# Patient Record
Sex: Female | Born: 1940 | Race: White | Hispanic: No | Marital: Married | Smoking: Former smoker
Health system: Southern US, Community
[De-identification: ages and names within clinical notes are randomized; demographics above are authoritative.]

## PROBLEM LIST (undated history)

## (undated) DIAGNOSIS — G919 Hydrocephalus, unspecified: Secondary | ICD-10-CM

## (undated) DIAGNOSIS — U099 Post covid-19 condition, unspecified: Secondary | ICD-10-CM

## (undated) DIAGNOSIS — J329 Chronic sinusitis, unspecified: Secondary | ICD-10-CM

## (undated) DIAGNOSIS — I4891 Unspecified atrial fibrillation: Secondary | ICD-10-CM

## (undated) HISTORY — PX: TYMPANOSTOMY TUBE PLACEMENT: SHX32

## (undated) HISTORY — PX: BACK SURGERY: SHX140

---

## 2017-01-23 DIAGNOSIS — I4892 Unspecified atrial flutter: Secondary | ICD-10-CM | POA: Diagnosis not present

## 2017-01-23 DIAGNOSIS — M810 Age-related osteoporosis without current pathological fracture: Secondary | ICD-10-CM | POA: Diagnosis not present

## 2017-01-23 DIAGNOSIS — I10 Essential (primary) hypertension: Secondary | ICD-10-CM | POA: Diagnosis not present

## 2017-01-23 DIAGNOSIS — E039 Hypothyroidism, unspecified: Secondary | ICD-10-CM | POA: Diagnosis not present

## 2017-01-23 DIAGNOSIS — E7800 Pure hypercholesterolemia, unspecified: Secondary | ICD-10-CM | POA: Diagnosis not present

## 2017-01-23 DIAGNOSIS — M47817 Spondylosis without myelopathy or radiculopathy, lumbosacral region: Secondary | ICD-10-CM | POA: Diagnosis not present

## 2017-02-10 DIAGNOSIS — Z719 Counseling, unspecified: Secondary | ICD-10-CM | POA: Diagnosis not present

## 2017-02-10 DIAGNOSIS — K580 Irritable bowel syndrome with diarrhea: Secondary | ICD-10-CM | POA: Diagnosis not present

## 2017-02-12 DIAGNOSIS — M810 Age-related osteoporosis without current pathological fracture: Secondary | ICD-10-CM | POA: Diagnosis not present

## 2017-02-12 DIAGNOSIS — M8580 Other specified disorders of bone density and structure, unspecified site: Secondary | ICD-10-CM | POA: Diagnosis not present

## 2017-02-12 DIAGNOSIS — M8589 Other specified disorders of bone density and structure, multiple sites: Secondary | ICD-10-CM | POA: Diagnosis not present

## 2017-02-17 DIAGNOSIS — M7918 Myalgia, other site: Secondary | ICD-10-CM | POA: Diagnosis not present

## 2017-02-17 DIAGNOSIS — M25511 Pain in right shoulder: Secondary | ICD-10-CM | POA: Diagnosis not present

## 2017-02-24 DIAGNOSIS — M461 Sacroiliitis, not elsewhere classified: Secondary | ICD-10-CM | POA: Diagnosis not present

## 2017-02-24 DIAGNOSIS — R52 Pain, unspecified: Secondary | ICD-10-CM | POA: Diagnosis not present

## 2017-03-11 DIAGNOSIS — H524 Presbyopia: Secondary | ICD-10-CM | POA: Diagnosis not present

## 2017-03-11 DIAGNOSIS — H26492 Other secondary cataract, left eye: Secondary | ICD-10-CM | POA: Diagnosis not present

## 2017-04-10 DIAGNOSIS — I10 Essential (primary) hypertension: Secondary | ICD-10-CM | POA: Diagnosis not present

## 2017-04-10 DIAGNOSIS — I483 Typical atrial flutter: Secondary | ICD-10-CM | POA: Diagnosis not present

## 2017-04-10 DIAGNOSIS — E7800 Pure hypercholesterolemia, unspecified: Secondary | ICD-10-CM | POA: Diagnosis not present

## 2017-05-04 DIAGNOSIS — M461 Sacroiliitis, not elsewhere classified: Secondary | ICD-10-CM | POA: Diagnosis not present

## 2017-05-04 DIAGNOSIS — M479 Spondylosis, unspecified: Secondary | ICD-10-CM | POA: Diagnosis not present

## 2017-05-04 DIAGNOSIS — G8929 Other chronic pain: Secondary | ICD-10-CM | POA: Diagnosis not present

## 2017-05-04 DIAGNOSIS — M25511 Pain in right shoulder: Secondary | ICD-10-CM | POA: Diagnosis not present

## 2017-06-16 DIAGNOSIS — G8929 Other chronic pain: Secondary | ICD-10-CM | POA: Diagnosis not present

## 2017-06-16 DIAGNOSIS — I2510 Atherosclerotic heart disease of native coronary artery without angina pectoris: Secondary | ICD-10-CM | POA: Diagnosis not present

## 2017-06-16 DIAGNOSIS — I10 Essential (primary) hypertension: Secondary | ICD-10-CM | POA: Diagnosis not present

## 2017-06-16 DIAGNOSIS — I4892 Unspecified atrial flutter: Secondary | ICD-10-CM | POA: Diagnosis not present

## 2017-06-16 DIAGNOSIS — E785 Hyperlipidemia, unspecified: Secondary | ICD-10-CM | POA: Diagnosis not present

## 2017-06-16 DIAGNOSIS — K210 Gastro-esophageal reflux disease with esophagitis: Secondary | ICD-10-CM | POA: Diagnosis not present

## 2017-06-16 DIAGNOSIS — E039 Hypothyroidism, unspecified: Secondary | ICD-10-CM | POA: Diagnosis not present

## 2017-06-16 DIAGNOSIS — K2970 Gastritis, unspecified, without bleeding: Secondary | ICD-10-CM | POA: Diagnosis not present

## 2017-06-16 DIAGNOSIS — I4891 Unspecified atrial fibrillation: Secondary | ICD-10-CM | POA: Diagnosis not present

## 2017-06-16 DIAGNOSIS — J449 Chronic obstructive pulmonary disease, unspecified: Secondary | ICD-10-CM | POA: Diagnosis not present

## 2017-06-24 DIAGNOSIS — I10 Essential (primary) hypertension: Secondary | ICD-10-CM | POA: Diagnosis not present

## 2017-06-24 DIAGNOSIS — K529 Noninfective gastroenteritis and colitis, unspecified: Secondary | ICD-10-CM | POA: Diagnosis not present

## 2017-06-24 DIAGNOSIS — E7800 Pure hypercholesterolemia, unspecified: Secondary | ICD-10-CM | POA: Diagnosis not present

## 2017-06-24 DIAGNOSIS — E039 Hypothyroidism, unspecified: Secondary | ICD-10-CM | POA: Diagnosis not present

## 2017-06-24 DIAGNOSIS — M8589 Other specified disorders of bone density and structure, multiple sites: Secondary | ICD-10-CM | POA: Diagnosis not present

## 2017-06-24 DIAGNOSIS — Z0000 Encounter for general adult medical examination without abnormal findings: Secondary | ICD-10-CM | POA: Diagnosis not present

## 2017-06-24 DIAGNOSIS — D6869 Other thrombophilia: Secondary | ICD-10-CM | POA: Diagnosis not present

## 2017-06-24 DIAGNOSIS — I483 Typical atrial flutter: Secondary | ICD-10-CM | POA: Diagnosis not present

## 2017-06-24 DIAGNOSIS — M461 Sacroiliitis, not elsewhere classified: Secondary | ICD-10-CM | POA: Diagnosis not present

## 2017-06-24 DIAGNOSIS — R7303 Prediabetes: Secondary | ICD-10-CM | POA: Diagnosis not present

## 2017-06-25 DIAGNOSIS — M47817 Spondylosis without myelopathy or radiculopathy, lumbosacral region: Secondary | ICD-10-CM | POA: Diagnosis not present

## 2017-07-09 DIAGNOSIS — M47817 Spondylosis without myelopathy or radiculopathy, lumbosacral region: Secondary | ICD-10-CM | POA: Diagnosis not present

## 2017-07-22 DIAGNOSIS — M25511 Pain in right shoulder: Secondary | ICD-10-CM | POA: Diagnosis not present

## 2017-07-22 DIAGNOSIS — G8929 Other chronic pain: Secondary | ICD-10-CM | POA: Diagnosis not present

## 2017-07-22 DIAGNOSIS — M5416 Radiculopathy, lumbar region: Secondary | ICD-10-CM | POA: Diagnosis not present

## 2017-07-22 DIAGNOSIS — M47816 Spondylosis without myelopathy or radiculopathy, lumbar region: Secondary | ICD-10-CM | POA: Diagnosis not present

## 2017-07-22 DIAGNOSIS — M5136 Other intervertebral disc degeneration, lumbar region: Secondary | ICD-10-CM | POA: Diagnosis not present

## 2017-07-22 DIAGNOSIS — M4856XA Collapsed vertebra, not elsewhere classified, lumbar region, initial encounter for fracture: Secondary | ICD-10-CM | POA: Diagnosis not present

## 2017-07-22 DIAGNOSIS — Z79891 Long term (current) use of opiate analgesic: Secondary | ICD-10-CM | POA: Diagnosis not present

## 2017-07-22 DIAGNOSIS — M4316 Spondylolisthesis, lumbar region: Secondary | ICD-10-CM | POA: Diagnosis not present

## 2017-07-22 DIAGNOSIS — M1288 Other specific arthropathies, not elsewhere classified, other specified site: Secondary | ICD-10-CM | POA: Diagnosis not present

## 2017-07-24 DIAGNOSIS — R52 Pain, unspecified: Secondary | ICD-10-CM | POA: Diagnosis not present

## 2017-07-24 DIAGNOSIS — J988 Other specified respiratory disorders: Secondary | ICD-10-CM | POA: Diagnosis not present

## 2017-07-24 DIAGNOSIS — M5416 Radiculopathy, lumbar region: Secondary | ICD-10-CM | POA: Diagnosis not present

## 2017-08-14 DIAGNOSIS — E039 Hypothyroidism, unspecified: Secondary | ICD-10-CM | POA: Diagnosis not present

## 2017-08-18 DIAGNOSIS — M818 Other osteoporosis without current pathological fracture: Secondary | ICD-10-CM | POA: Diagnosis not present

## 2017-08-28 DIAGNOSIS — R69 Illness, unspecified: Secondary | ICD-10-CM | POA: Diagnosis not present

## 2017-09-01 DIAGNOSIS — M818 Other osteoporosis without current pathological fracture: Secondary | ICD-10-CM | POA: Diagnosis not present

## 2017-11-18 DIAGNOSIS — M5416 Radiculopathy, lumbar region: Secondary | ICD-10-CM | POA: Diagnosis not present

## 2017-11-18 DIAGNOSIS — M25511 Pain in right shoulder: Secondary | ICD-10-CM | POA: Diagnosis not present

## 2017-11-18 DIAGNOSIS — M47816 Spondylosis without myelopathy or radiculopathy, lumbar region: Secondary | ICD-10-CM | POA: Diagnosis not present

## 2017-11-18 DIAGNOSIS — G8929 Other chronic pain: Secondary | ICD-10-CM | POA: Diagnosis not present

## 2017-12-15 DIAGNOSIS — M818 Other osteoporosis without current pathological fracture: Secondary | ICD-10-CM | POA: Diagnosis not present

## 2017-12-25 DIAGNOSIS — M150 Primary generalized (osteo)arthritis: Secondary | ICD-10-CM | POA: Diagnosis not present

## 2017-12-25 DIAGNOSIS — M7918 Myalgia, other site: Secondary | ICD-10-CM | POA: Diagnosis not present

## 2017-12-25 DIAGNOSIS — I10 Essential (primary) hypertension: Secondary | ICD-10-CM | POA: Diagnosis not present

## 2017-12-25 DIAGNOSIS — Z8739 Personal history of other diseases of the musculoskeletal system and connective tissue: Secondary | ICD-10-CM | POA: Diagnosis not present

## 2017-12-25 DIAGNOSIS — E7800 Pure hypercholesterolemia, unspecified: Secondary | ICD-10-CM | POA: Diagnosis not present

## 2017-12-25 DIAGNOSIS — E039 Hypothyroidism, unspecified: Secondary | ICD-10-CM | POA: Diagnosis not present

## 2017-12-25 DIAGNOSIS — H269 Unspecified cataract: Secondary | ICD-10-CM | POA: Diagnosis not present

## 2017-12-25 DIAGNOSIS — R197 Diarrhea, unspecified: Secondary | ICD-10-CM | POA: Diagnosis not present

## 2017-12-25 DIAGNOSIS — I483 Typical atrial flutter: Secondary | ICD-10-CM | POA: Diagnosis not present

## 2018-01-08 DIAGNOSIS — M545 Low back pain: Secondary | ICD-10-CM | POA: Diagnosis not present

## 2018-01-08 DIAGNOSIS — G8929 Other chronic pain: Secondary | ICD-10-CM | POA: Diagnosis not present

## 2018-01-08 DIAGNOSIS — R52 Pain, unspecified: Secondary | ICD-10-CM | POA: Diagnosis not present

## 2018-01-08 DIAGNOSIS — M47817 Spondylosis without myelopathy or radiculopathy, lumbosacral region: Secondary | ICD-10-CM | POA: Diagnosis not present

## 2018-01-08 DIAGNOSIS — M47816 Spondylosis without myelopathy or radiculopathy, lumbar region: Secondary | ICD-10-CM | POA: Diagnosis not present

## 2018-01-19 DIAGNOSIS — K580 Irritable bowel syndrome with diarrhea: Secondary | ICD-10-CM | POA: Diagnosis not present

## 2018-01-19 DIAGNOSIS — R634 Abnormal weight loss: Secondary | ICD-10-CM | POA: Diagnosis not present

## 2018-01-19 DIAGNOSIS — I10 Essential (primary) hypertension: Secondary | ICD-10-CM | POA: Diagnosis not present

## 2018-01-19 DIAGNOSIS — R140 Abdominal distension (gaseous): Secondary | ICD-10-CM | POA: Diagnosis not present

## 2018-02-08 DIAGNOSIS — M25611 Stiffness of right shoulder, not elsewhere classified: Secondary | ICD-10-CM | POA: Diagnosis not present

## 2018-02-08 DIAGNOSIS — M25511 Pain in right shoulder: Secondary | ICD-10-CM | POA: Diagnosis not present

## 2018-02-11 DIAGNOSIS — M7541 Impingement syndrome of right shoulder: Secondary | ICD-10-CM | POA: Diagnosis not present

## 2018-02-11 DIAGNOSIS — M25511 Pain in right shoulder: Secondary | ICD-10-CM | POA: Diagnosis not present

## 2018-02-11 DIAGNOSIS — M5412 Radiculopathy, cervical region: Secondary | ICD-10-CM | POA: Diagnosis not present

## 2018-02-11 DIAGNOSIS — M542 Cervicalgia: Secondary | ICD-10-CM | POA: Diagnosis not present

## 2018-02-12 DIAGNOSIS — M25611 Stiffness of right shoulder, not elsewhere classified: Secondary | ICD-10-CM | POA: Diagnosis not present

## 2018-02-12 DIAGNOSIS — M25511 Pain in right shoulder: Secondary | ICD-10-CM | POA: Diagnosis not present

## 2018-02-15 DIAGNOSIS — M25511 Pain in right shoulder: Secondary | ICD-10-CM | POA: Diagnosis not present

## 2018-02-15 DIAGNOSIS — G8929 Other chronic pain: Secondary | ICD-10-CM | POA: Diagnosis not present

## 2018-02-15 DIAGNOSIS — M47816 Spondylosis without myelopathy or radiculopathy, lumbar region: Secondary | ICD-10-CM | POA: Diagnosis not present

## 2018-02-15 DIAGNOSIS — M5416 Radiculopathy, lumbar region: Secondary | ICD-10-CM | POA: Diagnosis not present

## 2018-02-16 DIAGNOSIS — M25611 Stiffness of right shoulder, not elsewhere classified: Secondary | ICD-10-CM | POA: Diagnosis not present

## 2018-02-16 DIAGNOSIS — M25511 Pain in right shoulder: Secondary | ICD-10-CM | POA: Diagnosis not present

## 2018-02-18 DIAGNOSIS — M25511 Pain in right shoulder: Secondary | ICD-10-CM | POA: Diagnosis not present

## 2018-02-18 DIAGNOSIS — M25611 Stiffness of right shoulder, not elsewhere classified: Secondary | ICD-10-CM | POA: Diagnosis not present

## 2018-02-23 DIAGNOSIS — K529 Noninfective gastroenteritis and colitis, unspecified: Secondary | ICD-10-CM | POA: Diagnosis not present

## 2018-02-23 DIAGNOSIS — D122 Benign neoplasm of ascending colon: Secondary | ICD-10-CM | POA: Diagnosis not present

## 2018-02-23 DIAGNOSIS — K635 Polyp of colon: Secondary | ICD-10-CM | POA: Diagnosis not present

## 2018-02-25 DIAGNOSIS — M25511 Pain in right shoulder: Secondary | ICD-10-CM | POA: Diagnosis not present

## 2018-02-25 DIAGNOSIS — M25611 Stiffness of right shoulder, not elsewhere classified: Secondary | ICD-10-CM | POA: Diagnosis not present

## 2018-02-25 DIAGNOSIS — M75111 Incomplete rotator cuff tear or rupture of right shoulder, not specified as traumatic: Secondary | ICD-10-CM | POA: Diagnosis not present

## 2018-03-02 DIAGNOSIS — M25511 Pain in right shoulder: Secondary | ICD-10-CM | POA: Diagnosis not present

## 2018-03-02 DIAGNOSIS — M25611 Stiffness of right shoulder, not elsewhere classified: Secondary | ICD-10-CM | POA: Diagnosis not present

## 2018-03-04 DIAGNOSIS — M25611 Stiffness of right shoulder, not elsewhere classified: Secondary | ICD-10-CM | POA: Diagnosis not present

## 2018-03-04 DIAGNOSIS — M25511 Pain in right shoulder: Secondary | ICD-10-CM | POA: Diagnosis not present

## 2018-03-11 DIAGNOSIS — M25511 Pain in right shoulder: Secondary | ICD-10-CM | POA: Diagnosis not present

## 2018-03-11 DIAGNOSIS — M25611 Stiffness of right shoulder, not elsewhere classified: Secondary | ICD-10-CM | POA: Diagnosis not present

## 2018-03-12 DIAGNOSIS — M545 Low back pain: Secondary | ICD-10-CM | POA: Diagnosis not present

## 2018-03-12 DIAGNOSIS — M47817 Spondylosis without myelopathy or radiculopathy, lumbosacral region: Secondary | ICD-10-CM | POA: Diagnosis not present

## 2018-03-12 DIAGNOSIS — M47816 Spondylosis without myelopathy or radiculopathy, lumbar region: Secondary | ICD-10-CM | POA: Diagnosis not present

## 2018-03-12 DIAGNOSIS — R52 Pain, unspecified: Secondary | ICD-10-CM | POA: Diagnosis not present

## 2018-03-12 DIAGNOSIS — G8929 Other chronic pain: Secondary | ICD-10-CM | POA: Diagnosis not present

## 2018-03-17 DIAGNOSIS — M75121 Complete rotator cuff tear or rupture of right shoulder, not specified as traumatic: Secondary | ICD-10-CM | POA: Diagnosis not present

## 2018-03-17 DIAGNOSIS — M25411 Effusion, right shoulder: Secondary | ICD-10-CM | POA: Diagnosis not present

## 2018-03-24 DIAGNOSIS — R69 Illness, unspecified: Secondary | ICD-10-CM | POA: Diagnosis not present

## 2018-04-27 DIAGNOSIS — I10 Essential (primary) hypertension: Secondary | ICD-10-CM | POA: Diagnosis not present

## 2018-04-27 DIAGNOSIS — R7303 Prediabetes: Secondary | ICD-10-CM | POA: Diagnosis not present

## 2018-04-27 DIAGNOSIS — I483 Typical atrial flutter: Secondary | ICD-10-CM | POA: Diagnosis not present

## 2018-04-27 DIAGNOSIS — E7800 Pure hypercholesterolemia, unspecified: Secondary | ICD-10-CM | POA: Diagnosis not present

## 2018-05-03 DIAGNOSIS — M25511 Pain in right shoulder: Secondary | ICD-10-CM | POA: Diagnosis not present

## 2018-05-03 DIAGNOSIS — M75111 Incomplete rotator cuff tear or rupture of right shoulder, not specified as traumatic: Secondary | ICD-10-CM | POA: Diagnosis not present

## 2018-05-03 DIAGNOSIS — Z01818 Encounter for other preprocedural examination: Secondary | ICD-10-CM | POA: Diagnosis not present

## 2018-05-12 DIAGNOSIS — M25511 Pain in right shoulder: Secondary | ICD-10-CM | POA: Diagnosis not present

## 2018-05-12 DIAGNOSIS — G8918 Other acute postprocedural pain: Secondary | ICD-10-CM | POA: Diagnosis not present

## 2018-05-12 DIAGNOSIS — I4891 Unspecified atrial fibrillation: Secondary | ICD-10-CM | POA: Diagnosis not present

## 2018-05-12 DIAGNOSIS — S46211A Strain of muscle, fascia and tendon of other parts of biceps, right arm, initial encounter: Secondary | ICD-10-CM | POA: Diagnosis not present

## 2018-05-12 DIAGNOSIS — I119 Hypertensive heart disease without heart failure: Secondary | ICD-10-CM | POA: Diagnosis not present

## 2018-05-12 DIAGNOSIS — M75121 Complete rotator cuff tear or rupture of right shoulder, not specified as traumatic: Secondary | ICD-10-CM | POA: Diagnosis not present

## 2018-05-12 DIAGNOSIS — E7800 Pure hypercholesterolemia, unspecified: Secondary | ICD-10-CM | POA: Diagnosis not present

## 2018-05-12 DIAGNOSIS — M66821 Spontaneous rupture of other tendons, right upper arm: Secondary | ICD-10-CM | POA: Diagnosis not present

## 2018-05-12 DIAGNOSIS — X58XXXA Exposure to other specified factors, initial encounter: Secondary | ICD-10-CM | POA: Diagnosis not present

## 2018-05-12 DIAGNOSIS — K219 Gastro-esophageal reflux disease without esophagitis: Secondary | ICD-10-CM | POA: Diagnosis not present

## 2018-05-12 DIAGNOSIS — M810 Age-related osteoporosis without current pathological fracture: Secondary | ICD-10-CM | POA: Diagnosis not present

## 2018-05-12 DIAGNOSIS — M7541 Impingement syndrome of right shoulder: Secondary | ICD-10-CM | POA: Diagnosis not present

## 2018-05-12 DIAGNOSIS — R69 Illness, unspecified: Secondary | ICD-10-CM | POA: Diagnosis not present

## 2018-05-12 DIAGNOSIS — R600 Localized edema: Secondary | ICD-10-CM | POA: Diagnosis not present

## 2018-05-12 DIAGNOSIS — I519 Heart disease, unspecified: Secondary | ICD-10-CM | POA: Diagnosis not present

## 2018-05-12 DIAGNOSIS — Z4889 Encounter for other specified surgical aftercare: Secondary | ICD-10-CM | POA: Diagnosis not present

## 2018-05-12 DIAGNOSIS — Z7901 Long term (current) use of anticoagulants: Secondary | ICD-10-CM | POA: Diagnosis not present

## 2018-05-27 DIAGNOSIS — M5416 Radiculopathy, lumbar region: Secondary | ICD-10-CM | POA: Diagnosis not present

## 2018-05-27 DIAGNOSIS — M25519 Pain in unspecified shoulder: Secondary | ICD-10-CM | POA: Diagnosis not present

## 2018-05-27 DIAGNOSIS — M47816 Spondylosis without myelopathy or radiculopathy, lumbar region: Secondary | ICD-10-CM | POA: Diagnosis not present

## 2018-05-27 DIAGNOSIS — G8929 Other chronic pain: Secondary | ICD-10-CM | POA: Diagnosis not present

## 2018-05-27 DIAGNOSIS — M545 Low back pain: Secondary | ICD-10-CM | POA: Diagnosis not present

## 2018-06-01 DIAGNOSIS — M25511 Pain in right shoulder: Secondary | ICD-10-CM | POA: Diagnosis not present

## 2018-06-10 DIAGNOSIS — M25511 Pain in right shoulder: Secondary | ICD-10-CM | POA: Diagnosis not present

## 2018-06-14 DIAGNOSIS — J309 Allergic rhinitis, unspecified: Secondary | ICD-10-CM | POA: Diagnosis not present

## 2018-06-14 DIAGNOSIS — K580 Irritable bowel syndrome with diarrhea: Secondary | ICD-10-CM | POA: Diagnosis not present

## 2018-06-14 DIAGNOSIS — E785 Hyperlipidemia, unspecified: Secondary | ICD-10-CM | POA: Diagnosis not present

## 2018-06-14 DIAGNOSIS — I4891 Unspecified atrial fibrillation: Secondary | ICD-10-CM | POA: Diagnosis not present

## 2018-06-14 DIAGNOSIS — M1990 Unspecified osteoarthritis, unspecified site: Secondary | ICD-10-CM | POA: Diagnosis not present

## 2018-06-14 DIAGNOSIS — E039 Hypothyroidism, unspecified: Secondary | ICD-10-CM | POA: Diagnosis not present

## 2018-06-14 DIAGNOSIS — I10 Essential (primary) hypertension: Secondary | ICD-10-CM | POA: Diagnosis not present

## 2018-06-14 DIAGNOSIS — I4892 Unspecified atrial flutter: Secondary | ICD-10-CM | POA: Diagnosis not present

## 2018-06-14 DIAGNOSIS — G8929 Other chronic pain: Secondary | ICD-10-CM | POA: Diagnosis not present

## 2018-06-14 DIAGNOSIS — M62838 Other muscle spasm: Secondary | ICD-10-CM | POA: Diagnosis not present

## 2018-06-15 DIAGNOSIS — M25511 Pain in right shoulder: Secondary | ICD-10-CM | POA: Diagnosis not present

## 2018-06-17 DIAGNOSIS — M25511 Pain in right shoulder: Secondary | ICD-10-CM | POA: Diagnosis not present

## 2018-06-22 DIAGNOSIS — M25511 Pain in right shoulder: Secondary | ICD-10-CM | POA: Diagnosis not present

## 2018-06-24 DIAGNOSIS — M25511 Pain in right shoulder: Secondary | ICD-10-CM | POA: Diagnosis not present

## 2018-06-29 DIAGNOSIS — M25511 Pain in right shoulder: Secondary | ICD-10-CM | POA: Diagnosis not present

## 2018-07-01 DIAGNOSIS — M25511 Pain in right shoulder: Secondary | ICD-10-CM | POA: Diagnosis not present

## 2018-07-06 DIAGNOSIS — M25511 Pain in right shoulder: Secondary | ICD-10-CM | POA: Diagnosis not present

## 2018-07-08 DIAGNOSIS — M25511 Pain in right shoulder: Secondary | ICD-10-CM | POA: Diagnosis not present

## 2018-07-13 DIAGNOSIS — M25511 Pain in right shoulder: Secondary | ICD-10-CM | POA: Diagnosis not present

## 2018-07-15 DIAGNOSIS — M25511 Pain in right shoulder: Secondary | ICD-10-CM | POA: Diagnosis not present

## 2018-07-20 DIAGNOSIS — M25511 Pain in right shoulder: Secondary | ICD-10-CM | POA: Diagnosis not present

## 2018-07-22 DIAGNOSIS — M25511 Pain in right shoulder: Secondary | ICD-10-CM | POA: Diagnosis not present

## 2018-07-27 DIAGNOSIS — M25511 Pain in right shoulder: Secondary | ICD-10-CM | POA: Diagnosis not present

## 2018-07-29 DIAGNOSIS — M25511 Pain in right shoulder: Secondary | ICD-10-CM | POA: Diagnosis not present

## 2018-08-03 DIAGNOSIS — M25511 Pain in right shoulder: Secondary | ICD-10-CM | POA: Diagnosis not present

## 2018-08-05 DIAGNOSIS — M25511 Pain in right shoulder: Secondary | ICD-10-CM | POA: Diagnosis not present

## 2018-08-10 DIAGNOSIS — M25511 Pain in right shoulder: Secondary | ICD-10-CM | POA: Diagnosis not present

## 2018-08-17 DIAGNOSIS — M25511 Pain in right shoulder: Secondary | ICD-10-CM | POA: Diagnosis not present

## 2018-08-19 DIAGNOSIS — M25511 Pain in right shoulder: Secondary | ICD-10-CM | POA: Diagnosis not present

## 2018-08-23 DIAGNOSIS — M47816 Spondylosis without myelopathy or radiculopathy, lumbar region: Secondary | ICD-10-CM | POA: Diagnosis not present

## 2018-08-23 DIAGNOSIS — M5416 Radiculopathy, lumbar region: Secondary | ICD-10-CM | POA: Diagnosis not present

## 2018-08-23 DIAGNOSIS — G8929 Other chronic pain: Secondary | ICD-10-CM | POA: Diagnosis not present

## 2018-08-23 DIAGNOSIS — M25519 Pain in unspecified shoulder: Secondary | ICD-10-CM | POA: Diagnosis not present

## 2018-08-24 DIAGNOSIS — M25511 Pain in right shoulder: Secondary | ICD-10-CM | POA: Diagnosis not present

## 2018-08-26 DIAGNOSIS — M25511 Pain in right shoulder: Secondary | ICD-10-CM | POA: Diagnosis not present

## 2018-08-31 DIAGNOSIS — M818 Other osteoporosis without current pathological fracture: Secondary | ICD-10-CM | POA: Diagnosis not present

## 2018-08-31 DIAGNOSIS — M25511 Pain in right shoulder: Secondary | ICD-10-CM | POA: Diagnosis not present

## 2018-09-02 DIAGNOSIS — M25511 Pain in right shoulder: Secondary | ICD-10-CM | POA: Diagnosis not present

## 2018-09-07 DIAGNOSIS — M25511 Pain in right shoulder: Secondary | ICD-10-CM | POA: Diagnosis not present

## 2018-09-09 DIAGNOSIS — M25512 Pain in left shoulder: Secondary | ICD-10-CM | POA: Diagnosis not present

## 2018-09-09 DIAGNOSIS — Z9889 Other specified postprocedural states: Secondary | ICD-10-CM | POA: Diagnosis not present

## 2018-09-14 DIAGNOSIS — M25511 Pain in right shoulder: Secondary | ICD-10-CM | POA: Diagnosis not present

## 2018-09-16 DIAGNOSIS — E039 Hypothyroidism, unspecified: Secondary | ICD-10-CM | POA: Diagnosis not present

## 2018-09-16 DIAGNOSIS — E7800 Pure hypercholesterolemia, unspecified: Secondary | ICD-10-CM | POA: Diagnosis not present

## 2018-09-16 DIAGNOSIS — M65332 Trigger finger, left middle finger: Secondary | ICD-10-CM | POA: Diagnosis not present

## 2018-09-16 DIAGNOSIS — D6859 Other primary thrombophilia: Secondary | ICD-10-CM | POA: Diagnosis not present

## 2018-09-16 DIAGNOSIS — R7303 Prediabetes: Secondary | ICD-10-CM | POA: Diagnosis not present

## 2018-09-16 DIAGNOSIS — M461 Sacroiliitis, not elsewhere classified: Secondary | ICD-10-CM | POA: Diagnosis not present

## 2018-09-16 DIAGNOSIS — I10 Essential (primary) hypertension: Secondary | ICD-10-CM | POA: Diagnosis not present

## 2018-09-16 DIAGNOSIS — I483 Typical atrial flutter: Secondary | ICD-10-CM | POA: Diagnosis not present

## 2018-09-17 DIAGNOSIS — H02834 Dermatochalasis of left upper eyelid: Secondary | ICD-10-CM | POA: Diagnosis not present

## 2018-09-17 DIAGNOSIS — H524 Presbyopia: Secondary | ICD-10-CM | POA: Diagnosis not present

## 2018-09-17 DIAGNOSIS — H02831 Dermatochalasis of right upper eyelid: Secondary | ICD-10-CM | POA: Diagnosis not present

## 2018-09-17 DIAGNOSIS — H5211 Myopia, right eye: Secondary | ICD-10-CM | POA: Diagnosis not present

## 2018-09-17 DIAGNOSIS — Z961 Presence of intraocular lens: Secondary | ICD-10-CM | POA: Diagnosis not present

## 2018-09-21 DIAGNOSIS — R69 Illness, unspecified: Secondary | ICD-10-CM | POA: Diagnosis not present

## 2018-09-21 DIAGNOSIS — M25511 Pain in right shoulder: Secondary | ICD-10-CM | POA: Diagnosis not present

## 2018-09-24 DIAGNOSIS — M79645 Pain in left finger(s): Secondary | ICD-10-CM | POA: Diagnosis not present

## 2018-09-24 DIAGNOSIS — Z9181 History of falling: Secondary | ICD-10-CM | POA: Diagnosis not present

## 2018-09-24 DIAGNOSIS — Z6822 Body mass index (BMI) 22.0-22.9, adult: Secondary | ICD-10-CM | POA: Diagnosis not present

## 2018-09-24 DIAGNOSIS — M65332 Trigger finger, left middle finger: Secondary | ICD-10-CM | POA: Diagnosis not present

## 2018-09-24 DIAGNOSIS — M1831 Unilateral post-traumatic osteoarthritis of first carpometacarpal joint, right hand: Secondary | ICD-10-CM | POA: Diagnosis not present

## 2018-09-28 DIAGNOSIS — M25511 Pain in right shoulder: Secondary | ICD-10-CM | POA: Diagnosis not present

## 2018-10-01 DIAGNOSIS — M545 Low back pain: Secondary | ICD-10-CM | POA: Diagnosis not present

## 2018-10-01 DIAGNOSIS — G8929 Other chronic pain: Secondary | ICD-10-CM | POA: Diagnosis not present

## 2018-10-01 DIAGNOSIS — M47816 Spondylosis without myelopathy or radiculopathy, lumbar region: Secondary | ICD-10-CM | POA: Diagnosis not present

## 2018-10-01 DIAGNOSIS — M25519 Pain in unspecified shoulder: Secondary | ICD-10-CM | POA: Diagnosis not present

## 2018-10-01 DIAGNOSIS — M5416 Radiculopathy, lumbar region: Secondary | ICD-10-CM | POA: Diagnosis not present

## 2018-10-05 DIAGNOSIS — M25511 Pain in right shoulder: Secondary | ICD-10-CM | POA: Diagnosis not present

## 2018-10-12 DIAGNOSIS — M25511 Pain in right shoulder: Secondary | ICD-10-CM | POA: Diagnosis not present

## 2018-10-26 DIAGNOSIS — M25512 Pain in left shoulder: Secondary | ICD-10-CM | POA: Diagnosis not present

## 2018-10-26 DIAGNOSIS — Z9889 Other specified postprocedural states: Secondary | ICD-10-CM | POA: Diagnosis not present

## 2018-11-04 DIAGNOSIS — M25511 Pain in right shoulder: Secondary | ICD-10-CM | POA: Diagnosis not present

## 2018-11-08 DIAGNOSIS — M79641 Pain in right hand: Secondary | ICD-10-CM | POA: Diagnosis not present

## 2018-11-08 DIAGNOSIS — M65332 Trigger finger, left middle finger: Secondary | ICD-10-CM | POA: Diagnosis not present

## 2018-11-08 DIAGNOSIS — M1831 Unilateral post-traumatic osteoarthritis of first carpometacarpal joint, right hand: Secondary | ICD-10-CM | POA: Diagnosis not present

## 2018-11-08 DIAGNOSIS — M79642 Pain in left hand: Secondary | ICD-10-CM | POA: Diagnosis not present

## 2018-11-16 DIAGNOSIS — M25511 Pain in right shoulder: Secondary | ICD-10-CM | POA: Diagnosis not present

## 2018-11-30 DIAGNOSIS — M25511 Pain in right shoulder: Secondary | ICD-10-CM | POA: Diagnosis not present

## 2018-12-06 DIAGNOSIS — Z872 Personal history of diseases of the skin and subcutaneous tissue: Secondary | ICD-10-CM | POA: Diagnosis not present

## 2018-12-06 DIAGNOSIS — L988 Other specified disorders of the skin and subcutaneous tissue: Secondary | ICD-10-CM | POA: Diagnosis not present

## 2018-12-06 DIAGNOSIS — D0339 Melanoma in situ of other parts of face: Secondary | ICD-10-CM | POA: Diagnosis not present

## 2018-12-06 DIAGNOSIS — D485 Neoplasm of uncertain behavior of skin: Secondary | ICD-10-CM | POA: Diagnosis not present

## 2018-12-06 DIAGNOSIS — L570 Actinic keratosis: Secondary | ICD-10-CM | POA: Diagnosis not present

## 2018-12-06 DIAGNOSIS — Z09 Encounter for follow-up examination after completed treatment for conditions other than malignant neoplasm: Secondary | ICD-10-CM | POA: Diagnosis not present

## 2018-12-14 DIAGNOSIS — M25511 Pain in right shoulder: Secondary | ICD-10-CM | POA: Diagnosis not present

## 2018-12-20 DIAGNOSIS — M79642 Pain in left hand: Secondary | ICD-10-CM | POA: Diagnosis not present

## 2018-12-20 DIAGNOSIS — M1831 Unilateral post-traumatic osteoarthritis of first carpometacarpal joint, right hand: Secondary | ICD-10-CM | POA: Diagnosis not present

## 2018-12-20 DIAGNOSIS — Z481 Encounter for planned postprocedural wound closure: Secondary | ICD-10-CM | POA: Diagnosis not present

## 2018-12-20 DIAGNOSIS — M79641 Pain in right hand: Secondary | ICD-10-CM | POA: Diagnosis not present

## 2018-12-20 DIAGNOSIS — M65332 Trigger finger, left middle finger: Secondary | ICD-10-CM | POA: Diagnosis not present

## 2018-12-27 DIAGNOSIS — M25519 Pain in unspecified shoulder: Secondary | ICD-10-CM | POA: Diagnosis not present

## 2018-12-27 DIAGNOSIS — M47816 Spondylosis without myelopathy or radiculopathy, lumbar region: Secondary | ICD-10-CM | POA: Diagnosis not present

## 2018-12-27 DIAGNOSIS — G8929 Other chronic pain: Secondary | ICD-10-CM | POA: Diagnosis not present

## 2018-12-27 DIAGNOSIS — M5416 Radiculopathy, lumbar region: Secondary | ICD-10-CM | POA: Diagnosis not present

## 2019-01-05 DIAGNOSIS — D0339 Melanoma in situ of other parts of face: Secondary | ICD-10-CM | POA: Diagnosis not present

## 2021-04-05 ENCOUNTER — Emergency Department (HOSPITAL_BASED_OUTPATIENT_CLINIC_OR_DEPARTMENT_OTHER): Payer: Medicare HMO

## 2021-04-05 ENCOUNTER — Emergency Department (HOSPITAL_BASED_OUTPATIENT_CLINIC_OR_DEPARTMENT_OTHER)
Admission: EM | Admit: 2021-04-05 | Discharge: 2021-04-05 | Disposition: A | Discharge status: Home / Self Care | Payer: Medicare HMO | Attending: Emergency Medicine | Admitting: Emergency Medicine

## 2021-04-05 ENCOUNTER — Other Ambulatory Visit: Payer: Self-pay

## 2021-04-05 ENCOUNTER — Encounter (HOSPITAL_BASED_OUTPATIENT_CLINIC_OR_DEPARTMENT_OTHER): Payer: Self-pay | Admitting: Obstetrics and Gynecology

## 2021-04-05 DIAGNOSIS — D72829 Elevated white blood cell count, unspecified: Secondary | ICD-10-CM | POA: Diagnosis not present

## 2021-04-05 DIAGNOSIS — R55 Syncope and collapse: Secondary | ICD-10-CM | POA: Diagnosis present

## 2021-04-05 DIAGNOSIS — R911 Solitary pulmonary nodule: Secondary | ICD-10-CM

## 2021-04-05 HISTORY — DX: Chronic sinusitis, unspecified: J32.9

## 2021-04-05 HISTORY — DX: Post covid-19 condition, unspecified: U09.9

## 2021-04-05 HISTORY — DX: Unspecified atrial fibrillation: I48.91

## 2021-04-05 HISTORY — DX: Hydrocephalus, unspecified: G91.9

## 2021-04-05 LAB — URINALYSIS, ROUTINE W REFLEX MICROSCOPIC
Bilirubin Urine: NEGATIVE
Glucose, UA: NEGATIVE mg/dL
Hgb urine dipstick: NEGATIVE
Ketones, ur: NEGATIVE mg/dL
Leukocytes,Ua: NEGATIVE
Nitrite: NEGATIVE
Protein, ur: NEGATIVE mg/dL
Specific Gravity, Urine: 1.012 (ref 1.005–1.030)
pH: 6 (ref 5.0–8.0)

## 2021-04-05 LAB — BASIC METABOLIC PANEL
Anion gap: 11 (ref 5–15)
BUN: 24 mg/dL — ABNORMAL HIGH (ref 8–23)
CO2: 23 mmol/L (ref 22–32)
Calcium: 9.5 mg/dL (ref 8.9–10.3)
Chloride: 102 mmol/L (ref 98–111)
Creatinine, Ser: 1.04 mg/dL — ABNORMAL HIGH (ref 0.44–1.00)
GFR, Estimated: 54 mL/min — ABNORMAL LOW (ref >60)
Glucose, Bld: 90 mg/dL (ref 70–99)
Potassium: 3.3 mmol/L — ABNORMAL LOW (ref 3.5–5.1)
Sodium: 136 mmol/L (ref 135–145)

## 2021-04-05 LAB — CBC
HCT: 44.6 % (ref 36.0–46.0)
Hemoglobin: 14.3 g/dL (ref 12.0–15.0)
MCH: 30 pg (ref 26.0–34.0)
MCHC: 32.1 g/dL (ref 30.0–36.0)
MCV: 93.5 fL (ref 80.0–100.0)
Platelets: 210 10*3/uL (ref 150–400)
RBC: 4.77 MIL/uL (ref 3.87–5.11)
RDW: 13.1 % (ref 11.5–15.5)
WBC: 14.4 10*3/uL — ABNORMAL HIGH (ref 4.0–10.5)
nRBC: 0 % (ref 0.0–0.2)

## 2021-04-05 MED ORDER — POTASSIUM CHLORIDE CRYS ER 20 MEQ PO TBCR
40.0000 meq | EXTENDED_RELEASE_TABLET | Freq: Once | ORAL | Status: AC
  Administered 2021-04-05: 40 meq via ORAL
  Filled 2021-04-05: qty 2

## 2021-04-05 NOTE — ED Triage Notes (Signed)
Patient reports to the ER after having a near syncopal event at her grandchildrens school. Patient had + Orthostatic changes on scene. 147/87 last BP. Patient reported to EMS that she had not drank much water and got overheated. Dizziness has not been persistent and received 547ms of fluid. CBG 123 ?

## 2021-04-05 NOTE — ED Provider Notes (Signed)
?[facility] ?Provider Note ? ? ?CSN: [id number] ?Arrival date & time: 04/05/21  1204 ? ?  ? ?History ? ?Chief Complaint  ?Patient presents with  ? Near Syncope  ? ? ?Margaret David is a 81 y.o. female. ? ?Patient presents to ER chief complaint of syncopal episode.  She states that she was at a school event for her grandchildren.  She states the room was full of people it was becoming hot and stuffy.  She tried to get some air at the bedside but before she could make it as she grew lightheaded and people have to catch her as she passed out.  Denies any trauma or fall.  Denies any preceding or current headache or chest pain or abdominal pain.  No recent illnesses such as fevers or cough or vomiting or diarrhea.  This time she states that she feels fine and prefers to go home.  She states that she was recently diagnosed with sinusitis and had finished a course of prednisone.  She is still on her second week of antibiotics. ? ? ?  ? ?Home Medications ?Prior to Admission medications   ?Not on File  ?   ? ?Allergies    ?Penicillins   ? ?Review of Systems   ?Review of Systems  ?Constitutional:  Negative for fever.  ?HENT:  Negative for ear pain.   ?Eyes:  Negative for pain.  ?Respiratory:  Negative for cough.   ?Cardiovascular:  Negative for chest pain.  ?Gastrointestinal:  Negative for abdominal pain.  ?Genitourinary:  Negative for flank pain.  ?Musculoskeletal:  Negative for back pain.  ?Skin:  Negative for rash.  ?Neurological:  Negative for headaches.  ? ?Physical Exam ?Updated Vital Signs ?BP (!) 143/70   Pulse (!) 57   Temp 97.6 ?F (36.4 ?C)   Resp 16   SpO2 100%  ?Physical Exam ?Constitutional:   ?   General: She is not in acute distress. ?   Appearance: Normal appearance.  ?HENT:  ?   Head: Normocephalic.  ?   Nose: Nose normal.  ?Eyes:  ?   Extraocular Movements: Extraocular movements intact.  ?Cardiovascular:  ?   Rate and Rhythm: Normal rate.  ?Pulmonary:  ?   Effort: Pulmonary  effort is normal.  ?Musculoskeletal:     ?   General: Normal range of motion.  ?   Cervical back: Normal range of motion.  ?Neurological:  ?   General: No focal deficit present.  ?   Mental Status: She is alert and oriented to person, place, and time. Mental status is at baseline.  ?   Cranial Nerves: No cranial nerve deficit.  ?   Motor: No weakness.  ?   Gait: Gait normal.  ? ? ?ED Results / Procedures / Treatments   ?Labs ?(all labs ordered are listed, but only abnormal results are displayed) ?Labs Reviewed  ?BASIC METABOLIC PANEL - Abnormal; Notable for the following components:  ?    Result Value  ? Potassium 3.3 (*)   ? BUN 24 (*)   ? Creatinine, Ser 1.04 (*)   ? GFR, Estimated 54 (*)   ? All other components within normal limits  ?CBC - Abnormal; Notable for the following components:  ? WBC 14.4 (*)   ? All other components within normal limits  ?URINALYSIS, ROUTINE W REFLEX MICROSCOPIC  ? ? ?EKG ?EKG Interpretation ? ?Date/Time:  Friday April 05 2021 12:45:55 EDT ?Ventricular Rate:  63 ?PR Interval:  184 ?QRS  Duration: 96 ?QT Interval:  442 ?QTC Calculation: 452 ?R Axis:   42 ?Text Interpretation: Normal sinus rhythm Normal ECG No previous ECGs available Confirmed by Thamas Jaegers ([id number]) on 04/05/2021 1:38:49 PM ? ?Radiology ?No results found. ? ?Procedures ?Procedures  ? ? ?Medications Ordered in ED ?Medications - No data to display ? ?ED Course/ Medical Decision Making/ A&P ?  ?                        ?Medical Decision Making ?Amount and/or Complexity of Data Reviewed ?Labs: ordered. ?Radiology: ordered. ? ? ?Chart review shows outpatient visit for sinusitis March 29, 2021. ? ?Patient placed on cardiac monitor mildly bradycardic rate otherwise sinus rhythm. ? ?History obtained from the patient as well as her husband at bedside. ? ?Comorbidities influencing complexity include history of atrial fibrillation. ? ?Labs were sent.  White count mildly elevated 14.4 possibly due to her recent prednisone use.   Chemistry otherwise unremarkable.  Urinalysis is normal. ? ?Patient remains asymptomatic. ? ?I had a lengthy discussion with her and her husband regarding recommendation for period of observation and cardiac monitoring in the hospital due to her advanced age, history of atrial fibrillation and syncope.  However the patient declines.  She states that she feels fine and understands the risks of cardiac arrhythmia and significant morbidity or mortality for recurrent arrhythmia causing recurrent syncope. ? ?Joint decision made, family refusing observation stay at this time and will follow-up closely with a cardiologist instead.  I advised him to call today.  I invited him to return anytime if they change her mind or would like further medical care. ? ?Discharge condition is fair. ? ? ? ? ? ? ? ?Final Clinical Impression(s) / ED Diagnoses ?Final diagnoses:  ?Syncope, unspecified syncope type  ? ? ?Rx / DC Orders ?ED Discharge Orders   ? ? None  ? ?  ? ? ?  ?Luna Fuse, MD ?04/05/21 1511 ? ?

## 2021-04-05 NOTE — Discharge Instructions (Addendum)
Your xray showed a left sided lung nodule.  Follow up with your doctor regarding this finding. ? ?Call your cardiologist in 1 or 2 days. ? ?Return to the ER if you have any chest discomfort difficulty breathing or have any additional concerns. ?

## 2023-04-15 IMAGING — DX DG CHEST 1V PORT
1 series · 1 of 1 positions shown · non-contrast
Comparison: None.

CLINICAL DATA: 80-year-old female with syncope.

EXAM:
PORTABLE CHEST - 1 VIEW

[chest ap]
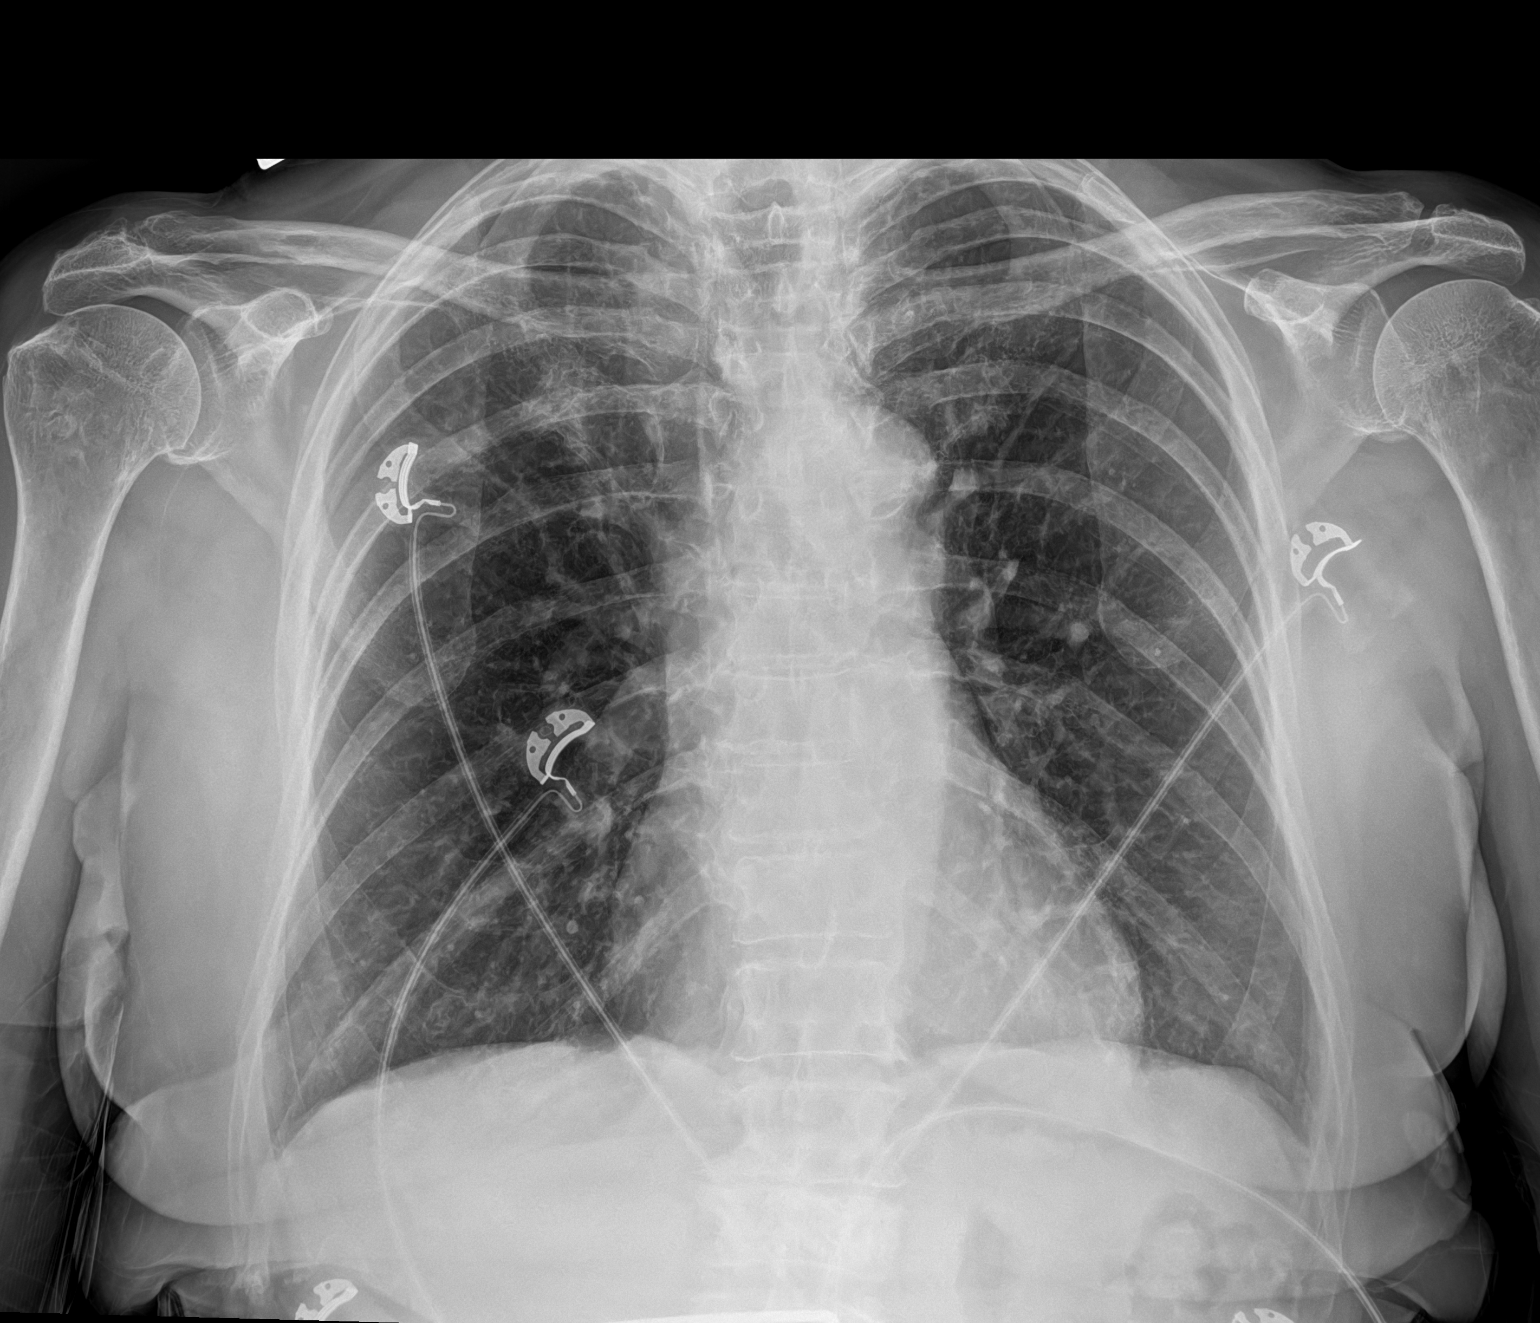

[1 of 1 positions shown; findings below may reference images not displayed]

FINDINGS: The mediastinal contours are within normal limits. No cardiomegaly.
Rounded nodular opacity in the left mid lung measuring up to
approximately 5 mm the lungs are otherwise clear bilaterally without
evidence of focal consolidation, pleural effusion, or pneumothorax.
No acute osseous abnormality.
IMPRESSION: 1. No acute cardiopulmonary process.
2. Indeterminate left mid lung nodular opacity which could represent
pulmonary nodule. Nonemergent, noncontrast chest CT could be
considered for further characterization.
